# Patient Record
Sex: Male | Born: 1985 | Race: Black or African American | Hispanic: No | Marital: Single
Health system: Southern US, Community
[De-identification: ages and names within clinical notes are randomized; demographics above are authoritative.]

---

## 2006-04-01 ENCOUNTER — Inpatient Hospital Stay (HOSPITAL_COMMUNITY): Admission: EM | Admit: 2006-04-01 | Discharge: 2006-04-13 | Payer: Self-pay | Admitting: Emergency Medicine

## 2006-04-10 ENCOUNTER — Ambulatory Visit: Payer: Self-pay | Admitting: Hematology & Oncology

## 2006-09-07 ENCOUNTER — Emergency Department (HOSPITAL_COMMUNITY): Admission: EM | Admit: 2006-09-07 | Discharge: 2006-09-07 | Payer: Self-pay | Admitting: Emergency Medicine

## 2007-12-29 IMAGING — CR DG CHEST 2V
2 series · 2 of 2 positions shown · non-contrast
Comparison: none

CLINICAL DATA: 20-year-old male, sickle cell pain.  
 CHEST - 2 VIEW:

[w chest pa *]
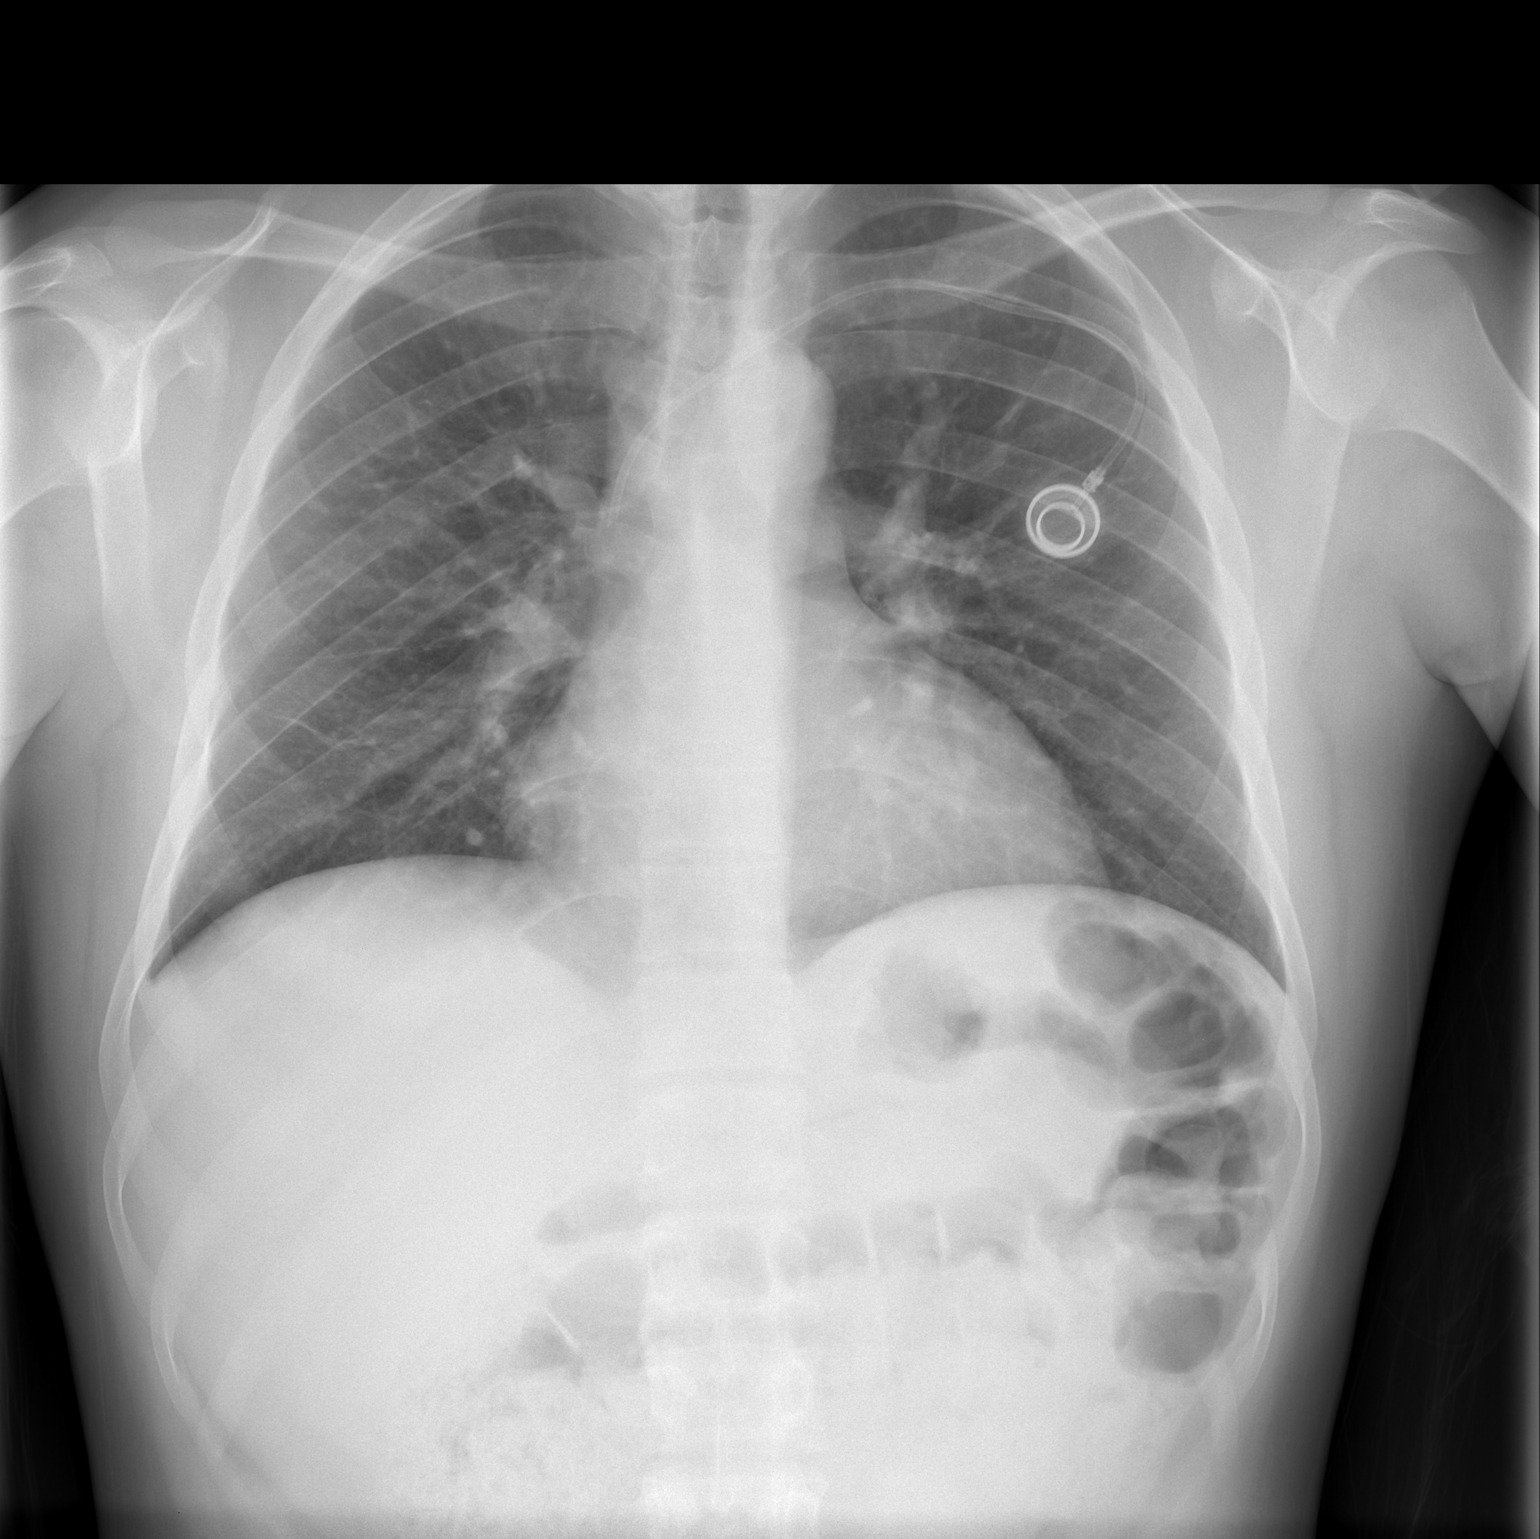

[w chest lat]
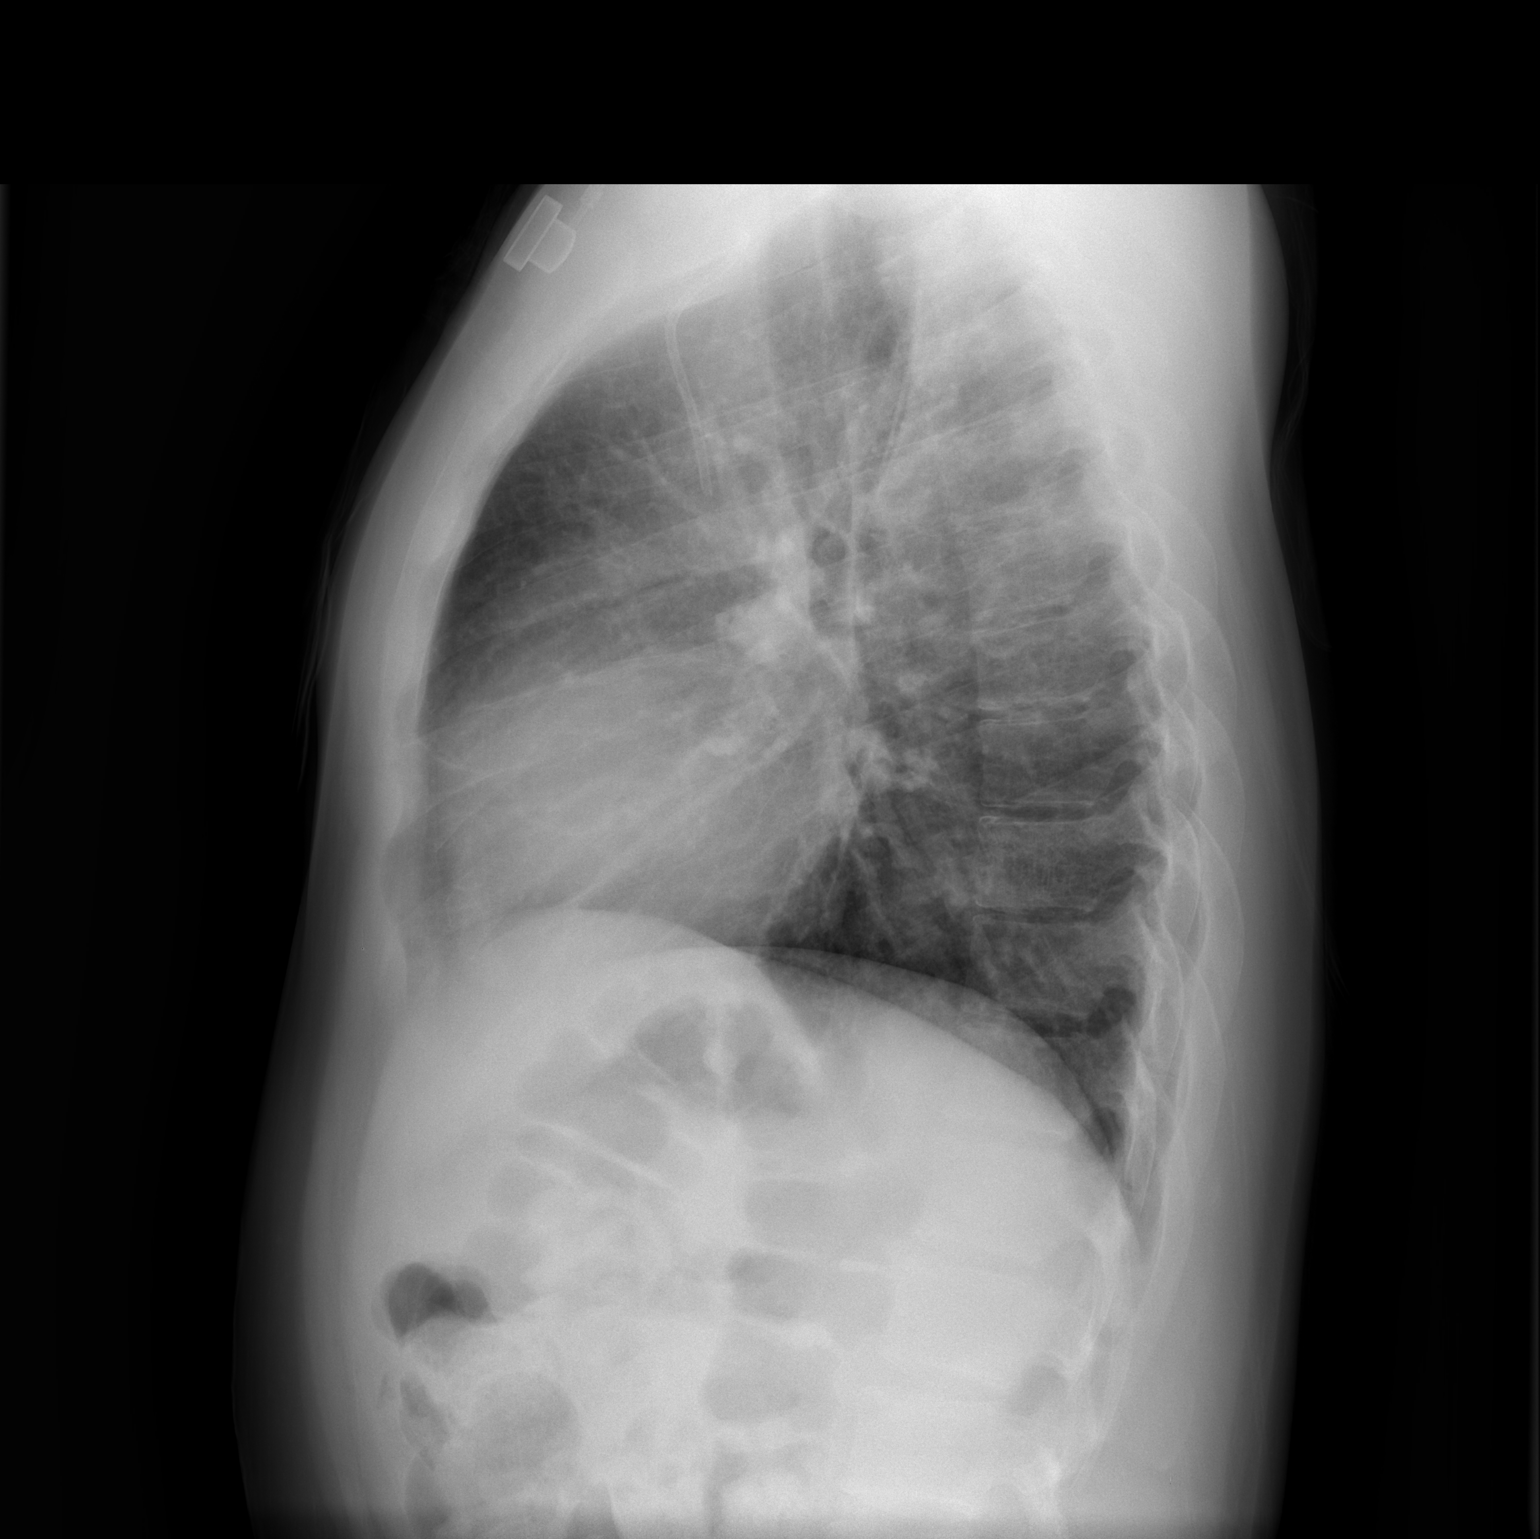

[2 of 2 positions shown; findings below may reference images not displayed]

FINDINGS: Lung volumes are low.  A left subclavian Port-A-Cath is in place.  Heart size is within normal limits.   Mild interstitial changes are likely chronic in nature.  No focal airspace disease is evident. Visualized soft tissues and bony thorax are unremarkable.
IMPRESSION: 1.  Low volumes with chronic interstitial lung markings. 
 2.  No focal airspace disease.

## 2013-10-03 DEATH — deceased

## 2017-06-24 ENCOUNTER — Encounter: Payer: Self-pay | Admitting: Internal Medicine

## 2017-06-24 NOTE — Progress Notes (Signed)
Opened in error
